# Patient Record
Sex: Male | Born: 1955 | Race: White | Hispanic: No | Marital: Married | Smoking: Never smoker
Health system: Southern US, Community
[De-identification: ages and names within clinical notes are randomized; demographics above are authoritative.]

## PROBLEM LIST (undated history)

## (undated) DIAGNOSIS — N200 Calculus of kidney: Secondary | ICD-10-CM

---

## 2005-11-03 ENCOUNTER — Emergency Department: Payer: Self-pay | Admitting: Emergency Medicine

## 2015-04-07 ENCOUNTER — Emergency Department
Admission: EM | Admit: 2015-04-07 | Discharge: 2015-04-07 | Disposition: A | Discharge status: Home / Self Care | Payer: Self-pay | Attending: Student | Admitting: Student

## 2015-04-07 ENCOUNTER — Encounter: Payer: Self-pay | Admitting: Emergency Medicine

## 2015-04-07 ENCOUNTER — Emergency Department: Payer: No Typology Code available for payment source

## 2015-04-07 DIAGNOSIS — K5900 Constipation, unspecified: Secondary | ICD-10-CM | POA: Insufficient documentation

## 2015-04-07 DIAGNOSIS — Z87891 Personal history of nicotine dependence: Secondary | ICD-10-CM | POA: Insufficient documentation

## 2015-04-07 DIAGNOSIS — N200 Calculus of kidney: Secondary | ICD-10-CM | POA: Insufficient documentation

## 2015-04-07 LAB — COMPREHENSIVE METABOLIC PANEL
ALT: 23 U/L (ref 17–63)
AST: 26 U/L (ref 15–41)
Albumin: 4.5 g/dL (ref 3.5–5.0)
Alkaline Phosphatase: 60 U/L (ref 38–126)
Anion gap: 11 (ref 5–15)
BUN: 20 mg/dL (ref 6–20)
CO2: 24 mmol/L (ref 22–32)
CREATININE: 1.23 mg/dL (ref 0.61–1.24)
Calcium: 9.5 mg/dL (ref 8.9–10.3)
Chloride: 104 mmol/L (ref 101–111)
GFR calc Af Amer: >60 mL/min (ref >60)
GFR calc non Af Amer: >60 mL/min (ref >60)
Glucose, Bld: 167 mg/dL — ABNORMAL HIGH (ref 65–99)
POTASSIUM: 3.9 mmol/L (ref 3.5–5.1)
Sodium: 139 mmol/L (ref 135–145)
TOTAL PROTEIN: 7.8 g/dL (ref 6.5–8.1)
Total Bilirubin: 0.9 mg/dL (ref 0.3–1.2)

## 2015-04-07 LAB — CBC WITH DIFFERENTIAL/PLATELET
BASOS PCT: 0 %
Basophils Absolute: 0 10*3/uL (ref 0–0.1)
EOS PCT: 0 %
Eosinophils Absolute: 0 10*3/uL (ref 0–0.7)
HEMATOCRIT: 44.7 % (ref 40.0–52.0)
Hemoglobin: 15.1 g/dL (ref 13.0–18.0)
LYMPHS ABS: 1.2 10*3/uL (ref 1.0–3.6)
LYMPHS PCT: 8 %
MCH: 30 pg (ref 26.0–34.0)
MCHC: 33.8 g/dL (ref 32.0–36.0)
MCV: 88.8 fL (ref 80.0–100.0)
MONO ABS: 0.6 10*3/uL (ref 0.2–1.0)
MONOS PCT: 4 %
NEUTROS PCT: 88 %
Neutro Abs: 13.2 10*3/uL — ABNORMAL HIGH (ref 1.4–6.5)
Platelets: 307 10*3/uL (ref 150–440)
RBC: 5.04 MIL/uL (ref 4.40–5.90)
RDW: 14.8 % — ABNORMAL HIGH (ref 11.5–14.5)
WBC: 15.1 10*3/uL — AB (ref 3.8–10.6)

## 2015-04-07 LAB — URINALYSIS COMPLETE WITH MICROSCOPIC (ARMC ONLY)
BILIRUBIN URINE: NEGATIVE
GLUCOSE, UA: 50 mg/dL — AB
LEUKOCYTES UA: NEGATIVE
NITRITE: NEGATIVE
PROTEIN: 100 mg/dL — AB
Specific Gravity, Urine: 1.028 (ref 1.005–1.030)
pH: 5 (ref 5.0–8.0)

## 2015-04-07 LAB — LIPASE, BLOOD: Lipase: 33 U/L (ref 22–51)

## 2015-04-07 MED ORDER — ONDANSETRON HCL 4 MG/2ML IJ SOLN
INTRAMUSCULAR | Status: AC
  Administered 2015-04-07: 4 mg via INTRAVENOUS
  Filled 2015-04-07: qty 2

## 2015-04-07 MED ORDER — SODIUM CHLORIDE 0.9 % IV BOLUS (SEPSIS)
1000.0000 mL | Freq: Once | INTRAVENOUS | Status: AC
  Administered 2015-04-07: 1000 mL via INTRAVENOUS

## 2015-04-07 MED ORDER — IOHEXOL 300 MG/ML  SOLN
100.0000 mL | Freq: Once | INTRAMUSCULAR | Status: AC | PRN
  Administered 2015-04-07: 100 mL via INTRAVENOUS

## 2015-04-07 MED ORDER — IOHEXOL 240 MG/ML SOLN
25.0000 mL | Freq: Once | INTRAMUSCULAR | Status: AC | PRN
  Administered 2015-04-07: 25 mL via ORAL

## 2015-04-07 MED ORDER — OXYCODONE HCL 5 MG PO TABS: 5.0000 mg | ORAL_TABLET | Freq: Four times a day (QID) | ORAL | Status: DC | PRN

## 2015-04-07 MED ORDER — ONDANSETRON HCL 4 MG/2ML IJ SOLN
4.0000 mg | Freq: Once | INTRAMUSCULAR | Status: AC
  Administered 2015-04-07: 4 mg via INTRAVENOUS

## 2015-04-07 MED ORDER — TAMSULOSIN HCL 0.4 MG PO CAPS: 0.4000 mg | ORAL_CAPSULE | Freq: Every day | ORAL | Status: DC

## 2015-04-07 MED ORDER — ONDANSETRON HCL 4 MG PO TABS: 4.0000 mg | ORAL_TABLET | Freq: Three times a day (TID) | ORAL | Status: DC | PRN

## 2015-04-07 NOTE — ED Notes (Signed)
Pt c/o LLQ abdominal pain for 2 days.  Has had vomiting with food/drink.  No BM for 2 days.

## 2015-04-07 NOTE — ED Notes (Signed)
Pt ambulatory to stat desk with c/o abd pain. No acute distress noted.

## 2015-04-07 NOTE — ED Notes (Signed)
Pt discharged home after verbalizing understanding of discharge instructions; nad noted. 

## 2015-04-07 NOTE — ED Provider Notes (Signed)
[facility] Emergency Department Provider Note  ____________________________________________  Time seen: Approximately 9:21 PM  I have reviewed the triage vital signs and the nursing notes.   HISTORY  Chief Complaint Abdominal Pain    HPI Lawrence Lloyd is a 59 y.o. male with no chronic medical problems who presents for evaluation of 2 days of gradual onset constant throbbing left lower quadrant pain. Current severity is mild. He has also had several episodes of nonbloody nonbilious emesis. He has been constipated and has not had a bowel movement for 2 days. He thinks he is still able to pass flatus. No fevers. No chest pain or difficulty breathing. No modifying factors. Not radiating or worsened by movement or exertion.  History reviewed. No pertinent past medical history.  There are no active problems to display for this patient.   History reviewed. No pertinent past surgical history.  Current Outpatient Rx  Name  Route  Sig  Dispense  Refill  . Omega-3 Fatty Acids (FISH OIL PO)   Oral   Take by mouth.           Allergies Review of patient's allergies indicates no known allergies.  History reviewed. No pertinent family history.  Social History History  Substance Use Topics  . Smoking status: Former Games developermoker  . Smokeless tobacco: Not on file  . Alcohol Use: Yes     Comment: rare    Review of Systems Constitutional: No fever/chills Eyes: No visual changes. ENT: No sore throat. Cardiovascular: Denies chest pain. Respiratory: Denies shortness of breath. Gastrointestinal: + abdominal pain.  +nausea, + vomiting.  No diarrhea.  + constipation. Genitourinary: Negative for dysuria. Musculoskeletal: Negative for back pain. Skin: Negative for rash. Neurological: Negative for headaches, focal weakness or numbness.  10-point ROS otherwise negative.  ____________________________________________   PHYSICAL EXAM:  VITAL SIGNS: ED Triage  Vitals  Enc Vitals Group     BP 04/07/15 1815 158/90 mmHg     Pulse Rate 04/07/15 1815 77     Resp --      Temp 04/07/15 1815 98.6 F (37 C)     Temp Source 04/07/15 1815 Oral     SpO2 04/07/15 1815 100 %     Weight 04/07/15 1815 225 lb (102.059 kg)     Height 04/07/15 1815 5\' 8"  (1.727 m)     Head Cir --      Peak Flow --      Pain Score 04/07/15 2109 5     Pain Loc --      Pain Edu? --      Excl. in GC? --     Constitutional: Alert and oriented. Well appearing and in no acute distress. Eyes: Conjunctivae are normal. PERRL. EOMI. Head: Atraumatic. Nose: No congestion/rhinnorhea. Mouth/Throat: Mucous membranes are moist.  Oropharynx non-erythematous. Neck: No stridor.  Cardiovascular: Normal rate, regular rhythm. Grossly normal heart sounds.  Good peripheral circulation. Respiratory: Normal respiratory effort.  No retractions. Lungs CTAB. Gastrointestinal: Soft with faint LLQ tenderness, no rebound or guarding. No distention. No abdominal bruits. No CVA tenderness. Genitourinary: deferred Musculoskeletal: No lower extremity tenderness nor edema.  No joint effusions. Neurologic:  Normal speech and language. No gross focal neurologic deficits are appreciated. Speech is normal. No gait instability. Skin:  Skin is warm, dry and intact. No rash noted. Psychiatric: Mood and affect are normal. Speech and behavior are normal.  ____________________________________________   LABS (all labs ordered are listed, but only abnormal results are displayed)  Labs Reviewed  CBC WITH DIFFERENTIAL/PLATELET - Abnormal; Notable for the following:    WBC 15.1 (*)    RDW 14.8 (*)    Neutro Abs 13.2 (*)    All other components within normal limits  COMPREHENSIVE METABOLIC PANEL - Abnormal; Notable for the following:    Glucose, Bld 167 (*)    All other components within normal limits  URINALYSIS COMPLETEWITH MICROSCOPIC ([facility] ONLY) - Abnormal; Notable for the following:    Color, Urine YELLOW  (*)    APPearance TURBID (*)    Glucose, UA 50 (*)    Ketones, ur 2+ (*)    Hgb urine dipstick 3+ (*)    Protein, ur 100 (*)    Bacteria, UA RARE (*)    Squamous Epithelial / LPF 0-5 (*)    All other components within normal limits  LIPASE, BLOOD   ____________________________________________  EKG  none ____________________________________________  RADIOLOGY  CT A/P IMPRESSION: 1. 7 x 4 mm proximal left ureteral stone with mild hydronephrosis. 2. Bilateral nephrolithiasis. 3. Hepatic steatosis. 4. 12 mm left adrenal nodule, nonspecific but statistically likely to represent adenoma. ____________________________________________   PROCEDURES  Procedure(s) performed: None  Critical Care performed: No  ____________________________________________   INITIAL IMPRESSION / ASSESSMENT AND PLAN / ED COURSE  Pertinent labs & imaging results that were available during my care of the patient were reviewed by me and considered in my medical decision making (see chart for details).  Lawrence Lloyd is a 59 y.o. male with no chronic medical problems who presents for evaluation of 2 days of gradual onset constant throbbing left lower quadrant pain. On exam, he is very well-appearing and in no acute distress. Vital signs stable, afebrile. He has leukocytosis as well as mild tenderness in left lower quadrant. We'll plan for CT of the abdomen and pelvis to evaluate further cause of his pain. He has declined any pain medications at this time.  ----------------------------------------- 11:27 PM on 04/07/2015 -----------------------------------------  CT shows 7 x 4 mm proximal left ureteral stone with mild hydronephrosis. No fever. Urinalysis does not appear to be consistent with an infected stone. Discussed with Dr. Vanna Scotland of urology who agrees with discharge and follow-up in urology clinic. We'll discharge with expectant management. Return precautions discussed extensively with the  patient and family and he is comfortable with discharge. He is tolerating PO. ____________________________________________   FINAL CLINICAL IMPRESSION(S) / ED DIAGNOSES  Final diagnoses:  Nephrolithiasis      Gayla Doss, MD 04/07/15 2328

## 2015-04-07 NOTE — ED Notes (Signed)
Patient awake, alert and oriented.  Verbalizing good understanding of discharge instructions, follow up care and medications.

## 2015-04-07 NOTE — Discharge Instructions (Signed)

## 2015-04-07 NOTE — ED Notes (Signed)
Pt has cup, unable to urinate at this time

## 2015-04-29 ENCOUNTER — Encounter: Payer: Self-pay | Admitting: *Deleted

## 2015-05-03 ENCOUNTER — Ambulatory Visit (INDEPENDENT_AMBULATORY_CARE_PROVIDER_SITE_OTHER): Payer: PRIVATE HEALTH INSURANCE | Admitting: Urology

## 2015-05-03 ENCOUNTER — Encounter: Payer: Self-pay | Admitting: Urology

## 2015-05-03 ENCOUNTER — Encounter
Admission: RE | Admit: 2015-05-03 | Discharge: 2015-05-03 | Disposition: A | Discharge status: Home / Self Care | Payer: PRIVATE HEALTH INSURANCE | Source: Ambulatory Visit | Attending: Urology | Admitting: Urology

## 2015-05-03 ENCOUNTER — Ambulatory Visit
Admission: RE | Admit: 2015-05-03 | Discharge: 2015-05-03 | Disposition: A | Discharge status: Home / Self Care | Payer: PRIVATE HEALTH INSURANCE | Source: Ambulatory Visit | Attending: Urology | Admitting: Urology

## 2015-05-03 VITALS — BP 159/90 | HR 84 | Ht 69.0 in | Wt 233.0 lb

## 2015-05-03 DIAGNOSIS — N132 Hydronephrosis with renal and ureteral calculous obstruction: Secondary | ICD-10-CM | POA: Insufficient documentation

## 2015-05-03 DIAGNOSIS — N200 Calculus of kidney: Secondary | ICD-10-CM | POA: Diagnosis not present

## 2015-05-03 DIAGNOSIS — R1032 Left lower quadrant pain: Secondary | ICD-10-CM | POA: Diagnosis not present

## 2015-05-03 DIAGNOSIS — N201 Calculus of ureter: Secondary | ICD-10-CM | POA: Diagnosis present

## 2015-05-03 DIAGNOSIS — R112 Nausea with vomiting, unspecified: Secondary | ICD-10-CM | POA: Diagnosis not present

## 2015-05-03 DIAGNOSIS — Z79899 Other long term (current) drug therapy: Secondary | ICD-10-CM | POA: Diagnosis not present

## 2015-05-03 DIAGNOSIS — Z87891 Personal history of nicotine dependence: Secondary | ICD-10-CM | POA: Diagnosis not present

## 2015-05-03 DIAGNOSIS — K760 Fatty (change of) liver, not elsewhere classified: Secondary | ICD-10-CM | POA: Diagnosis not present

## 2015-05-03 LAB — MICROSCOPIC EXAMINATION: Bacteria, UA: NONE SEEN

## 2015-05-03 LAB — URINALYSIS, COMPLETE
BILIRUBIN UA: NEGATIVE
Ketones, UA: NEGATIVE
Leukocytes, UA: NEGATIVE
Nitrite, UA: NEGATIVE
PH UA: 7 (ref 5.0–7.5)
PROTEIN UA: NEGATIVE
SPEC GRAV UA: 1.015 (ref 1.005–1.030)
UUROB: 0.2 mg/dL (ref 0.2–1.0)

## 2015-05-03 NOTE — Progress Notes (Signed)
05/03/2015 3:54 PM   Lawrence Lloyd 10/05/1956 [id number]  Referring provider: No referring provider defined for this encounter.  Chief Complaint  Patient presents with  . Nephrolithiasis    ER referral    HPI: Lawrence Lloyd is a 59 year old white male who presented to the emergency room on 04/07/2015 after a 2 day history of left sided abdominal pain. He stated when the pain presented initially, he just ignored it hoping it was going to go away on its own. The pain became increasingly worse over the next 2 days until it became so intense he felt like he was going to pass out pass out.   He sought further treatment in the ED.   There, a CT scan noted a 7 mm stone in the left proximal ureter with mild hydronephrosis.   He was discharged from the emergency room with oxycodone, Zofran and tamsulosin and instructed to follow-up with [place].  Currently, he is experiencing intermittent bouts of flank pain. He can go 2 days with no pain and then the next day have severe pain lasting 4-5 hours. He denies any fevers, chills or gross hematuria. He does admit to nausea and vomiting. He does not have a prior history of kidney stone disease. Up until his emergency room visit, he was consuming three 12 ounces of [facility] daily. His father also has a history of kidney stones.   PMH: Benign  Surgical History: No past surgical history on file.  Home Medications:    Medication List       This list is accurate as of: 05/03/15  3:54 PM.  Always use your most recent med list.               FISH OIL PO  Take by mouth.     ondansetron 4 MG tablet  Commonly known as:  ZOFRAN  Take 1 tablet (4 mg total) by mouth every 8 (eight) hours as needed for nausea or vomiting.     oxyCODONE 5 MG immediate release tablet  Commonly known as:  ROXICODONE  Take 1 tablet (5 mg total) by mouth every 6 (six) hours as needed for moderate pain (may take 1-2 tabs PO q6h prn pain; do NOT drive while taking this  medication).     tamsulosin 0.4 MG Caps capsule  Commonly known as:  FLOMAX  Take 1 capsule (0.4 mg total) by mouth daily.        Allergies: No Known Allergies  Family History: Family History  Problem Relation Age of Onset  . Kidney Stones Father   . COPD Father   . Heart disease Father   . Cancer Neg Hx     Prostate,kidney,bladder  . Dementia Mother     Social History:  reports that he has quit smoking. He does not have any smokeless tobacco history on file. He reports that he drinks alcohol. He reports that he does not use illicit drugs.  ROS: Urological Symptom Review  Patient is experiencing the following symptoms: Kidney stone   Review of Systems  Gastrointestinal (upper)  : Nausea Vomiting  Gastrointestinal (lower) : Negative for lower GI symptoms  Constitutional : Negative for symptoms  Skin: Negative for skin symptoms  Eyes: Negative for eye symptoms  Ear/Nose/Throat : Negative for Ear/Nose/Throat symptoms  Hematologic/Lymphatic: Negative for Hematologic/Lymphatic symptoms  Cardiovascular : Negative for cardiovascular symptoms  Respiratory : Negative for respiratory symptoms  Endocrine: Negative for endocrine symptoms  Musculoskeletal: Negative for musculoskeletal symptoms  Neurological: Negative  for neurological symptoms  Psychologic: Negative for psychiatric symptoms   Physical Exam: BP 159/90 mmHg  Pulse 84  Ht  (1.753 m)  Wt 233 lb (105.688 kg)  BMI 34.39 kg/m2  Constitutional:  Alert and oriented, No acute distress. HEENT: [place] AT, moist mucus membranes.  Trachea midline, no masses. Cardiovascular: No clubbing, cyanosis, or edema. Respiratory: Normal respiratory effort, no increased work of breathing. GI: Abdomen is soft, non tender, non distended, no abdominal masses GU: No CVA tenderness.  Skin: No rashes, bruises or suspicious lesions. Lymph: No cervical or inguinal adenopathy. Neurologic: Grossly intact, no  focal deficits, moving all 4 extremities. Psychiatric: Normal mood and affect.  Laboratory Data: Results for orders placed or performed in visit on 05/03/15  Microscopic Examination  Result Value Ref Range   WBC, UA 0-5 0 -  5 /hpf   RBC, UA 3-10 (A) 0 -  2 /hpf   Epithelial Cells (non renal) 0-10 0 - 10 /hpf   Bacteria, UA None seen None seen/Few  Urinalysis, Complete  Result Value Ref Range   Specific Gravity, UA 1.015 1.005 - 1.030   pH, UA 7.0 5.0 - 7.5   Color, UA Yellow Yellow   Appearance Ur Clear Clear   Leukocytes, UA Negative Negative   Protein, UA Negative Negative/Trace   Glucose, UA 1+ (A) Negative   Ketones, UA Negative Negative   RBC, UA 1+ (A) Negative   Bilirubin, UA Negative Negative   Urobilinogen, Ur 0.2 0.2 - 1.0 mg/dL   Nitrite, UA Negative Negative   Microscopic Examination See below:    Lab Results  Component Value Date   WBC 15.1* 04/07/2015   HGB 15.1 04/07/2015   HCT 44.7 04/07/2015   MCV 88.8 04/07/2015   PLT 307 04/07/2015    Lab Results  Component Value Date   CREATININE 1.23 04/07/2015    No results found for: PSA  No results found for: TESTOSTERONE  No results found for: HGBA1C  Urinalysis    Component Value Date/Time   COLORURINE YELLOW* 04/07/2015 1826   APPEARANCEUR TURBID* 04/07/2015 1826   LABSPEC 1.028 04/07/2015 1826   PHURINE 5.0 04/07/2015 1826   GLUCOSEU 1+* 05/03/2015 1001   HGBUR 3+* 04/07/2015 1826   BILIRUBINUR Negative 05/03/2015 1001   BILIRUBINUR NEGATIVE 04/07/2015 1826   KETONESUR 2+* 04/07/2015 1826   PROTEINUR 100* 04/07/2015 1826   NITRITE Negative 05/03/2015 1001   NITRITE NEGATIVE 04/07/2015 1826   LEUKOCYTESUR Negative 05/03/2015 1001   LEUKOCYTESUR NEGATIVE 04/07/2015 1826    Pertinent Imaging: CLINICAL DATA: Left lower quadrant pain for 2 days  EXAM: CT ABDOMEN AND PELVIS WITH CONTRAST  TECHNIQUE: Multidetector CT imaging of the abdomen and pelvis was performed using the standard  protocol following bolus administration of intravenous contrast.  CONTRAST: OMNIPAQUE IOHEXOL 300 MG/ML SOLN  COMPARISON: None.  FINDINGS: BODY WALL: No contributory findings.  LOWER CHEST: No contributory findings.  ABDOMEN/PELVIS:  Liver: Hepatic steatosis  Biliary: No evidence of biliary obstruction or stone.  Pancreas: Unremarkable.  Spleen: Unremarkable.  Adrenals: 12 mm nodule left adrenal gland appears homogeneous and low-density. No history of malignancy in the electronic medical record.  Kidneys and ureters: 7 x 4 mm stone in the proximal left ureter with mild hydronephrosis. Bilateral nephrolithiasis with upper pole calculi. The largest stone is on the left and measures up to 7 mm. Bilateral renal cortical cysts, largest on the right measuring 33 mm  Bladder: Unremarkable.  Reproductive: No pathologic findings.  Bowel:  No obstruction. Normal appendix.  Retroperitoneum: No mass or adenopathy.  Peritoneum: No ascites or pneumoperitoneum.  Vascular: No acute abnormality.  OSSEOUS: Diffuse degenerative disc disease with maximal disc narrowing at L5-S1. Lower lumbar facet arthropathy with spurring.  IMPRESSION: 1. 7 x 4 mm proximal left ureteral stone with mild hydronephrosis. 2. Bilateral nephrolithiasis. 3. Hepatic steatosis. 4. 12 mm left adrenal nodule, nonspecific but statistically likely to represent adenoma.   Electronically Signed  By: Marnee Spring M.D.  On: 04/07/2015 23:08  Assessment & Plan:    1. Left ureteral stone:  I discussed ESWL and URS/LL/ureteral stent placement as options for the treatment of his stone. Since the stone is visible on KUB, he would like to proceed with ESWL.  I explained to the patient the procedure has approximately 80% success rate in treating stones. If the stone does not break up with ESWL, he will require a repeat ESWL or another procedure for definitive treatment of the stone.  I explained the risks of ESWL to the patient, such as: infection, pain, bleeding and the phenomenon of Steinstrausse, a condition when the fragments stack upon themselves causing obstruction. If this should occur, he would need ureteral stent placement.  He is agreeable to the plan and the risks and would like to proceed with ESWL.  - Urinalysis, Complete - CULTURE, URINE COMPREHENSIVE  2. Hydronephrosis:  Patient will have a follow-up renal ultrasound once the stone is treated to ensure the resolution of the hydronephrosis.    3. Bilateral nephrolithiasis:  Patient was informed that consuming beverages like [facility] contributed to stone formation. He states he read that on the Internet and has been drinking water since the stone pain started. His father also has a history of nephrolithiasis. We will obtain a 24-hour urine once the left ureteral stone is treated.  4. PSA Screening:  Patient does not have a primary care physician and has not had a recent prostate exam. Once the stone has been treated, we will discuss PSA screening as it is recommended in his age group. No Follow-up on file.  Michiel Cowboy, PA-C  [facility] [address], [address] [place], [place] [address] [phone]

## 2015-05-05 LAB — CULTURE, URINE COMPREHENSIVE

## 2015-05-06 ENCOUNTER — Ambulatory Visit
Admission: RE | Admit: 2015-05-06 | Discharge: 2015-05-06 | Disposition: A | Discharge status: Home / Self Care | Payer: PRIVATE HEALTH INSURANCE | Source: Ambulatory Visit | Attending: Urology | Admitting: Urology

## 2015-05-06 ENCOUNTER — Ambulatory Visit: Payer: PRIVATE HEALTH INSURANCE

## 2015-05-06 ENCOUNTER — Encounter
Admission: RE | Disposition: A | Discharge status: Home / Self Care | Payer: Self-pay | Source: Ambulatory Visit | Attending: Urology

## 2015-05-06 ENCOUNTER — Encounter: Payer: Self-pay | Admitting: *Deleted

## 2015-05-06 DIAGNOSIS — K760 Fatty (change of) liver, not elsewhere classified: Secondary | ICD-10-CM | POA: Insufficient documentation

## 2015-05-06 DIAGNOSIS — N200 Calculus of kidney: Secondary | ICD-10-CM

## 2015-05-06 DIAGNOSIS — R112 Nausea with vomiting, unspecified: Secondary | ICD-10-CM | POA: Insufficient documentation

## 2015-05-06 DIAGNOSIS — Z87891 Personal history of nicotine dependence: Secondary | ICD-10-CM | POA: Insufficient documentation

## 2015-05-06 DIAGNOSIS — Z79899 Other long term (current) drug therapy: Secondary | ICD-10-CM | POA: Insufficient documentation

## 2015-05-06 DIAGNOSIS — N132 Hydronephrosis with renal and ureteral calculous obstruction: Secondary | ICD-10-CM | POA: Diagnosis not present

## 2015-05-06 DIAGNOSIS — R1032 Left lower quadrant pain: Secondary | ICD-10-CM | POA: Insufficient documentation

## 2015-05-06 HISTORY — PX: EXTRACORPOREAL SHOCK WAVE LITHOTRIPSY: SHX1557

## 2015-05-06 HISTORY — DX: Calculus of kidney: N20.0

## 2015-05-06 SURGERY — LITHOTRIPSY, ESWL
Anesthesia: Monitor Anesthesia Care | Laterality: Left

## 2015-05-06 MED ORDER — CIPROFLOXACIN HCL 500 MG PO TABS
ORAL_TABLET | ORAL | Status: AC
  Administered 2015-05-06: 500 mg via ORAL
  Filled 2015-05-06: qty 1

## 2015-05-06 MED ORDER — DEXTROSE-NACL 5-0.45 % IV SOLN
INTRAVENOUS | Status: DC
  Administered 2015-05-06: 100 mL/h via INTRAVENOUS

## 2015-05-06 MED ORDER — DIPHENHYDRAMINE HCL 25 MG PO CAPS
25.0000 mg | ORAL_CAPSULE | ORAL | Status: AC
  Administered 2015-05-06: 25 mg via ORAL

## 2015-05-06 MED ORDER — DIPHENHYDRAMINE HCL 25 MG PO CAPS
ORAL_CAPSULE | ORAL | Status: AC
  Administered 2015-05-06: 25 mg via ORAL
  Filled 2015-05-06: qty 1

## 2015-05-06 MED ORDER — CIPROFLOXACIN HCL 500 MG PO TABS
500.0000 mg | ORAL_TABLET | ORAL | Status: AC
  Administered 2015-05-06: 500 mg via ORAL

## 2015-05-06 MED ORDER — DIAZEPAM 5 MG PO TABS
ORAL_TABLET | ORAL | Status: AC
  Administered 2015-05-06: 10 mg via ORAL
  Filled 2015-05-06: qty 2

## 2015-05-06 MED ORDER — OXYCODONE HCL 5 MG PO TABS: 5.0000 mg | ORAL_TABLET | Freq: Four times a day (QID) | ORAL | Status: DC | PRN

## 2015-05-06 MED ORDER — DIAZEPAM 5 MG PO TABS
10.0000 mg | ORAL_TABLET | ORAL | Status: AC
  Administered 2015-05-06: 10 mg via ORAL

## 2015-05-06 NOTE — Discharge Instructions (Signed)
See [facility] discharge instructions in chart.  Please get xray on the day of your follow up just prior to your follow up appointment.    Follow the lithotripsy instructions as reviewed in office.

## 2015-05-06 NOTE — OR Nursing (Signed)
Patient denies any of the medical history that was noted by the CMA at the urologist office.  All history was reviewed with patient through the lithotripsy RN and patient states he is not aware of any of the diagnosis' that were checked for his medical history.  Patient states no problems other than kidney stones.

## 2015-05-06 NOTE — H&P (Signed)
05/03/2015 3:54 PM   Lawrence Lloyd 18-May-1956 [id number]  Referring provider: No referring provider defined for this encounter.  Chief Complaint  Patient presents with  . Nephrolithiasis    ER referral    HPI: Lawrence Lloyd is a 59 year old white male who presented to the emergency room on 04/07/2015 after a 2 day history of left sided abdominal pain. He stated when the pain presented initially, he just ignored it hoping it was going to go away on its own. The pain became increasingly worse over the next 2 days until it became so intense he felt like he was going to pass out pass out.   He sought further treatment in the ED. There, a CT scan noted a 7 mm stone in the left proximal ureter with mild hydronephrosis. He was discharged from the emergency room with oxycodone, Zofran and tamsulosin and instructed to follow-up with [place].  Currently, he is experiencing intermittent bouts of flank pain. He can go 2 days with no pain and then the next day have severe pain lasting 4-5 hours. He denies any fevers, chills or gross hematuria. He does admit to nausea and vomiting. He does not have a prior history of kidney stone disease. Up until his emergency room visit, he was consuming three 12 ounces of [facility] daily. His father also has a history of kidney stones.   PMH: Benign  Surgical History: No past surgical history on file.  Home Medications:    Medication List       This list is accurate as of: 05/03/15 3:54 PM. Always use your most recent med list.              FISH OIL PO  Take by mouth.     ondansetron 4 MG tablet  Commonly known as: ZOFRAN  Take 1 tablet (4 mg total) by mouth every 8 (eight) hours as needed for nausea or vomiting.     oxyCODONE 5 MG immediate release tablet  Commonly known as: ROXICODONE  Take 1 tablet (5 mg total) by mouth every 6 (six) hours as needed for moderate pain (may take 1-2 tabs PO q6h prn pain; do  NOT drive while taking this medication).     tamsulosin 0.4 MG Caps capsule  Commonly known as: FLOMAX  Take 1 capsule (0.4 mg total) by mouth daily.        Allergies: No Known Allergies  Family History: Family History  Problem Relation Age of Onset  . Kidney Stones Father   . COPD Father   . Heart disease Father   . Cancer Neg Hx     Prostate,kidney,bladder  . Dementia Mother     Social History:  reports that he has quit smoking. He does not have any smokeless tobacco history on file. He reports that he drinks alcohol. He reports that he does not use illicit drugs.  ROS: Urological Symptom Review  Patient is experiencing the following symptoms: Kidney stone   Review of Systems  Gastrointestinal (upper) : Nausea Vomiting  Gastrointestinal (lower) : Negative for lower GI symptoms  Constitutional : Negative for symptoms  Skin: Negative for skin symptoms  Eyes: Negative for eye symptoms  Ear/Nose/Throat : Negative for Ear/Nose/Throat symptoms  Hematologic/Lymphatic: Negative for Hematologic/Lymphatic symptoms  Cardiovascular : Negative for cardiovascular symptoms  Respiratory : Negative for respiratory symptoms  Endocrine: Negative for endocrine symptoms  Musculoskeletal: Negative for musculoskeletal symptoms  Neurological: Negative for neurological symptoms  Psychologic: Negative for psychiatric symptoms  Physical Exam: BP 159/90 mmHg  Pulse 84  Ht  (1.753 m)  Wt 233 lb (105.688 kg)  BMI 34.39 kg/m2  Constitutional: Alert and oriented, No acute distress. HEENT: [place] AT, moist mucus membranes. Trachea midline, no masses. Cardiovascular: No clubbing, cyanosis, or edema. Respiratory: Normal respiratory effort, no increased work of breathing. GI: Abdomen is soft, non tender, non distended, no abdominal masses GU: No CVA tenderness.  Skin: No rashes, bruises or suspicious lesions. Lymph: No  cervical or inguinal adenopathy. Neurologic: Grossly intact, no focal deficits, moving all 4 extremities. Psychiatric: Normal mood and affect.  Laboratory Data: Results for orders placed or performed in visit on 05/03/15  Microscopic Examination  Result Value Ref Range   WBC, UA 0-5 0 - 5 /hpf   RBC, UA 3-10 (A) 0 - 2 /hpf   Epithelial Cells (non renal) 0-10 0 - 10 /hpf   Bacteria, UA None seen None seen/Few  Urinalysis, Complete  Result Value Ref Range   Specific Gravity, UA 1.015 1.005 - 1.030   pH, UA 7.0 5.0 - 7.5   Color, UA Yellow Yellow   Appearance Ur Clear Clear   Leukocytes, UA Negative Negative   Protein, UA Negative Negative/Trace   Glucose, UA 1+ (A) Negative   Ketones, UA Negative Negative   RBC, UA 1+ (A) Negative   Bilirubin, UA Negative Negative   Urobilinogen, Ur 0.2 0.2 - 1.0 mg/dL   Nitrite, UA Negative Negative   Microscopic Examination See below:     Recent Labs    Lab Results  Component Value Date   WBC 15.1* 04/07/2015   HGB 15.1 04/07/2015   HCT 44.7 04/07/2015   MCV 88.8 04/07/2015   PLT 307 04/07/2015       Recent Labs    Lab Results  Component Value Date   CREATININE 1.23 04/07/2015       Recent Labs    No results found for: PSA     Recent Labs    No results found for: TESTOSTERONE     Recent Labs    No results found for: HGBA1C    Urinalysis  Labs (Brief)       Component Value Date/Time   COLORURINE YELLOW* 04/07/2015 1826   APPEARANCEUR TURBID* 04/07/2015 1826   LABSPEC 1.028 04/07/2015 1826   PHURINE 5.0 04/07/2015 1826   GLUCOSEU 1+* 05/03/2015 1001   HGBUR 3+* 04/07/2015 1826   BILIRUBINUR Negative 05/03/2015 1001   BILIRUBINUR NEGATIVE 04/07/2015 1826   KETONESUR 2+* 04/07/2015 1826   PROTEINUR 100* 04/07/2015 1826   NITRITE Negative 05/03/2015 1001    NITRITE NEGATIVE 04/07/2015 1826   LEUKOCYTESUR Negative 05/03/2015 1001   LEUKOCYTESUR NEGATIVE 04/07/2015 1826      Pertinent Imaging: CLINICAL DATA: Left lower quadrant pain for 2 days  EXAM: CT ABDOMEN AND PELVIS WITH CONTRAST  TECHNIQUE: Multidetector CT imaging of the abdomen and pelvis was performed using the standard protocol following bolus administration of intravenous contrast.  CONTRAST: OMNIPAQUE IOHEXOL 300 MG/ML SOLN  COMPARISON: None.  FINDINGS: BODY WALL: No contributory findings.  LOWER CHEST: No contributory findings.  ABDOMEN/PELVIS:  Liver: Hepatic steatosis  Biliary: No evidence of biliary obstruction or stone.  Pancreas: Unremarkable.  Spleen: Unremarkable.  Adrenals: 12 mm nodule left adrenal gland appears homogeneous and low-density. No history of malignancy in the electronic medical record.  Kidneys and ureters: 7 x 4 mm stone in the proximal left ureter with mild hydronephrosis. Bilateral nephrolithiasis with upper pole calculi. The  largest stone is on the left and measures up to 7 mm. Bilateral renal cortical cysts, largest on the right measuring 33 mm  Bladder: Unremarkable.  Reproductive: No pathologic findings.  Bowel: No obstruction. Normal appendix.  Retroperitoneum: No mass or adenopathy.  Peritoneum: No ascites or pneumoperitoneum.  Vascular: No acute abnormality.  OSSEOUS: Diffuse degenerative disc disease with maximal disc narrowing at L5-S1. Lower lumbar facet arthropathy with spurring.  IMPRESSION: 1. 7 x 4 mm proximal left ureteral stone with mild hydronephrosis. 2. Bilateral nephrolithiasis. 3. Hepatic steatosis. 4. 12 mm left adrenal nodule, nonspecific but statistically likely to represent adenoma.   Electronically Signed  By: Marnee Spring M.D.  On: 04/07/2015 23:08  Assessment & Plan:   1. Left ureteral stone: I discussed ESWL and URS/LL/ureteral  stent placement as options for the treatment of his stone. Since the stone is visible on KUB, he would like to proceed with ESWL. I explained to the patient the procedure has approximately 80% success rate in treating stones. If the stone does not break up with ESWL, he will require a repeat ESWL or another procedure for definitive treatment of the stone. I explained the risks of ESWL to the patient, such as: infection, pain, bleeding and the phenomenon of Steinstrausse, a condition when the fragments stack upon themselves causing obstruction. If this should occur, he would need ureteral stent placement. He is agreeable to the plan and the risks and would like to proceed with ESWL.  - Urinalysis, Complete - CULTURE, URINE COMPREHENSIVE  2. Hydronephrosis: Patient will have a follow-up renal ultrasound once the stone is treated to ensure the resolution of the hydronephrosis.   3. Bilateral nephrolithiasis: Patient was informed that consuming beverages like [facility] contributed to stone formation. He states he read that on the Internet and has been drinking water since the stone pain started. His father also has a history of nephrolithiasis. We will obtain a 24-hour urine once the left ureteral stone is treated.  4. PSA Screening: Patient does not have a primary care physician and has not had a recent prostate exam. Once the stone has been treated, we will discuss PSA screening as it is recommended in his age group. No Follow-up on file.  Michiel Cowboy, PA-C  [facility] [address] [place], [place] [address] [phone]

## 2015-05-31 ENCOUNTER — Ambulatory Visit
Admission: RE | Admit: 2015-05-31 | Discharge: 2015-05-31 | Disposition: A | Discharge status: Home / Self Care | Payer: PRIVATE HEALTH INSURANCE | Source: Ambulatory Visit | Attending: Urology | Admitting: Urology

## 2015-05-31 ENCOUNTER — Ambulatory Visit (INDEPENDENT_AMBULATORY_CARE_PROVIDER_SITE_OTHER): Payer: PRIVATE HEALTH INSURANCE | Admitting: Urology

## 2015-05-31 ENCOUNTER — Encounter: Payer: Self-pay | Admitting: Urology

## 2015-05-31 VITALS — BP 169/101 | HR 75 | Ht 69.0 in | Wt 229.7 lb

## 2015-05-31 DIAGNOSIS — Z125 Encounter for screening for malignant neoplasm of prostate: Secondary | ICD-10-CM

## 2015-05-31 DIAGNOSIS — N201 Calculus of ureter: Secondary | ICD-10-CM | POA: Diagnosis not present

## 2015-05-31 DIAGNOSIS — N200 Calculus of kidney: Secondary | ICD-10-CM | POA: Diagnosis not present

## 2015-05-31 DIAGNOSIS — I10 Essential (primary) hypertension: Secondary | ICD-10-CM

## 2015-05-31 LAB — URINALYSIS, COMPLETE
Bilirubin, UA: NEGATIVE
Glucose, UA: NEGATIVE
Ketones, UA: NEGATIVE
LEUKOCYTES UA: NEGATIVE
Nitrite, UA: NEGATIVE
Protein, UA: NEGATIVE
RBC, UA: NEGATIVE
Specific Gravity, UA: 1.025 (ref 1.005–1.030)
UUROB: 0.2 mg/dL (ref 0.2–1.0)
pH, UA: 5.5 (ref 5.0–7.5)

## 2015-05-31 LAB — MICROSCOPIC EXAMINATION

## 2015-05-31 NOTE — Progress Notes (Signed)
10:55 AM   Lawrence Lloyd 01-Dec-1955 [id number]  Referring provider: No referring provider defined for this encounter.  Chief Complaint  Patient presents with  . Nephrolithiasis    post ESWL w/KUB    HPI: 59 year old male with a 7 mm left distal ureteral stone status post ESWL. He tolerated the procedure quite well. He's had no further flank pain on the left side.  He did strain his urine but did not see any stone fragments pass. KUB today shows interval passage of his left distal stone.  Today, he denies any fevers, chills or gross hematuria.    He does not have a prior history of kidney stone disease. Up until his emergency room visit, he was consuming three 12 ounces of [facility] daily. His father also has a history of kidney stones.  He does have other bilateral nonobstructing stones.  Patient does not have a PCP.   PMH: Benign  Surgical History: History reviewed. No pertinent past surgical history.  Home Medications:    Medication List       This list is accurate as of: 05/31/15 10:55 AM.  Always use your most recent med list.               FISH OIL PO  Take by mouth.     multivitamin capsule  Take 1 capsule by mouth daily.        Allergies: No Known Allergies  Family History: Family History  Problem Relation Age of Onset  . Kidney Stones Father   . COPD Father   . Heart disease Father   . Cancer Neg Hx     Prostate,kidney,bladder  . Dementia Mother     Social History:  reports that he has never smoked. He has quit using smokeless tobacco. His smokeless tobacco use included Chew. He reports that he drinks alcohol. He reports that he does not use illicit drugs.   ROS UROLOGY Frequent Urination?: No Hard to postpone urination?: No Burning/pain with urination?: No Get up at night to urinate?: No Leakage of urine?: No Urine stream starts and stops?: No Trouble starting stream?: No Do you have to strain to urinate?: No Blood in urine?:  No Urinary tract infection?: No Sexually transmitted disease?: No Injury to kidneys or bladder?: No Painful intercourse?: No Weak stream?: No Erection problems?: No Penile pain?: No Gastrointestinal Nausea?: No Vomiting?: No Indigestion/heartburn?: No Diarrhea?: No Constipation?: No Constitutional Fever: No Night sweats?: No Weight loss?: No Fatigue?: No Skin Skin rash/lesions?: No Itching?: No Eyes Blurred vision?: No Double vision?: No Ears/Nose/Throat Sore throat?: No Sinus problems?: No Hematologic/Lymphatic Swollen glands?: No Easy bruising?: No Cardiovascular Leg swelling?: No Chest pain?: No Respiratory Cough?: No Shortness of breath?: No Endocrine Excessive thirst?: No Musculoskeletal Back pain?: No Joint pain?: No Neurological Headaches?: No Dizziness?: No Psychologic Depression?: No Anxiety?: No  Physical Exam: BP 169/101 mmHg  Pulse 75  Ht  (1.753 m)  Wt 229 lb 11.2 oz (104.191 kg)  BMI 33.91 kg/m2  Constitutional:  Alert and oriented, No acute distress. HEENT: [place] AT, moist mucus membranes.  Trachea midline, no masses. Cardiovascular: No clubbing, cyanosis, or edema. Respiratory: Normal respiratory effort, no increased work of breathing. GI: Abdomen is soft, non tender, non distended, no abdominal masses GU: No CVA tenderness.  Neurologic: Grossly intact, no focal deficits, moving all 4 extremities. Psychiatric: Normal mood and affect.  Laboratory Data: Lab Results  Component Value Date   WBC 15.1* 04/07/2015   HGB  15.1 04/07/2015   HCT 44.7 04/07/2015   MCV 88.8 04/07/2015   PLT 307 04/07/2015    Lab Results  Component Value Date   CREATININE 1.23 04/07/2015    Urinalysis Urine dipstick shows negative for all components.  Micro exam: 6-10 WBC's per HPF.  Pertinent Imaging: CLINICAL DATA: Kidney stones.  EXAM: ABDOMEN - 1 VIEW  COMPARISON: None.  FINDINGS: Bilateral nephrolithiasis. Previously identified  left lower urolithiasis has cleared. Pelvic calcifications consistent phleboliths. Bowel gas pattern is unremarkable. No acute bony abnormality. Degenerative changes lumbar spine .  IMPRESSION: 1. Interim passage of distal left ureteral stone. 2. Stable bilateral nephrolithiasis .   Electronically Signed  By: Maisie Fushomas Register  On: 05/31/2015 09:01  Assessment & Plan:  59 year old male with history of left ureteral nephrolithiasis status post successful ESWL.  1. Left ureteral stone KUB today shows resolution of stone.   - Urinalysis, Complete  2. Kidney stones Bilateral nonobstructing stones seen, largest 7 mm.  Patient wishes to see if he can pass these stones. Recommend follow-up with KUB in 1 year.We discussed general stone prevention techniques including drinking plenty water with goal of producing 2.5 L urine daily, increased citric acid intake, avoidance of high oxalate containing foods, and decreased salt intake.  Information about dietary recommendations given today.   - DG Abd 1 View; Future  3. Essential hypertension Hypertensive today in clinic, asymptomatic. Patient does not have a primary care physician. I have highly recommended that he seek out medical care with a primary care physician in the near future.  Suspect he had underlying hypertension prior to any of these procedures and was just not seeking care previously.  4. Prostate cancer screening The natural history of prostate cancer and ongoing controversy regarding screening and potential treatment outcomes of prostate cancer has been discussed with the patient. The meaning of a false positive PSA and a false negative PSA has been discussed. He indicates understanding of the limitations of this screening test and wishes NOT to proceed with screening PSA testing.   Return in about 6 months (around 12/01/2015) for KUB.  Vanna ScotlandAshley Mindel Friscia, MD  [facility] [address], [address] [place], [place] [address] [phone]

## 2015-05-31 NOTE — Patient Instructions (Signed)
Dietary Guidelines to Help Prevent Kidney Stones  Your risk of kidney stones can be decreased by adjusting the foods you eat. The most important thing you can do is drink enough fluid. You should drink enough fluid to keep your urine clear or pale yellow. The following guidelines provide specific information for the type of kidney stone you have had.  GUIDELINES ACCORDING TO TYPE OF KIDNEY STONE  Calcium Oxalate Kidney Stones  · Reduce the amount of salt you eat. Foods that have a lot of salt cause your body to release excess calcium into your urine. The excess calcium can combine with a substance called oxalate to form kidney stones.  · Reduce the amount of animal protein you eat if the amount you eat is excessive. Animal protein causes your body to release excess calcium into your urine. Ask your dietitian how much protein from animal sources you should be eating.  · Avoid foods that are high in oxalates. If you take vitamins, they should have less than 500 mg of vitamin C. Your body turns vitamin C into oxalates. You do not need to avoid fruits and vegetables high in vitamin C.  Calcium Phosphate Kidney Stones  · Reduce the amount of salt you eat to help prevent the release of excess calcium into your urine.  · Reduce the amount of animal protein you eat if the amount you eat is excessive. Animal protein causes your body to release excess calcium into your urine. Ask your dietitian how much protein from animal sources you should be eating.  · Get enough calcium from food or take a calcium supplement (ask your dietitian for recommendations). Food sources of calcium that do not increase your risk of kidney stones include:  ¨ Broccoli.  ¨ Dairy products, such as cheese and yogurt.  ¨ Pudding.  Uric Acid Kidney Stones  · Do not have more than 6 oz of animal protein per day.  FOOD SOURCES  Animal Protein Sources  · Meat (all types).  · Poultry.  · Eggs.  · Fish, seafood.  Foods High in Salt  · Salt seasonings.  · Soy  sauce.  · Teriyaki sauce.  · Cured and processed meats.  · Salted crackers and snack foods.  · Fast food.  · Canned soups and most canned foods.  Foods High in Oxalates  · Grains:  ¨ Amaranth.  ¨ Barley.  ¨ Grits.  ¨ Wheat germ.  ¨ Bran.  ¨ Buckwheat flour.  ¨ All bran cereals.  ¨ Pretzels.  ¨ Whole wheat bread.  · Vegetables:  ¨ Beans (wax).  ¨ Beets and beet greens.  ¨ Collard greens.  ¨ Eggplant.  ¨ Escarole.  ¨ Leeks.  ¨ Okra.  ¨ Parsley.  ¨ Rutabagas.  ¨ Spinach.  ¨ Swiss chard.  ¨ Tomato paste.  ¨ Fried potatoes.  ¨ Sweet potatoes.  · Fruits:  ¨ Red currants.  ¨ Figs.  ¨ Kiwi.  ¨ Rhubarb.  · Meat and Other Protein Sources:  ¨ Beans (dried).  ¨ Soy burgers and other soybean products.  ¨ Miso.  ¨ Nuts (peanuts, almonds, pecans, cashews, hazelnuts).  ¨ Nut butters.  ¨ Sesame seeds and tahini (paste made of sesame seeds).  ¨ Poppy seeds.  · Beverages:  ¨ Chocolate drink mixes.  ¨ Soy milk.  ¨ Instant iced tea.  ¨ Juices made from high-oxalate fruits or vegetables.  · Other:  ¨ Carob.  ¨ Chocolate.  ¨ Fruitcake.  ¨ Marmalades.  Document Released:   02/24/2011 Document Revised: 11/04/2013 Document Reviewed: 09/26/2013  ExitCare® Patient Information ©2015 [employer]. This information is not intended to replace advice given to you by your health care provider. Make sure you discuss any questions you have with your health care provider.

## 2015-07-23 ENCOUNTER — Encounter: Payer: Self-pay | Admitting: Urology

## 2015-12-03 ENCOUNTER — Encounter: Payer: Self-pay | Admitting: Urology

## 2015-12-03 ENCOUNTER — Ambulatory Visit: Payer: PRIVATE HEALTH INSURANCE | Admitting: Urology

## 2016-01-02 IMAGING — CR DG ABDOMEN 1V
1 series · 2 of 2 positions shown · non-contrast
Comparison: CT 04/07/2015.

CLINICAL DATA: Ureteral stone.

EXAM:
ABDOMEN - 1 VIEW

[Series 1: dg abd 1 view · 0.14mm/px · 2 of 2 slices shown]
[im 1/2]
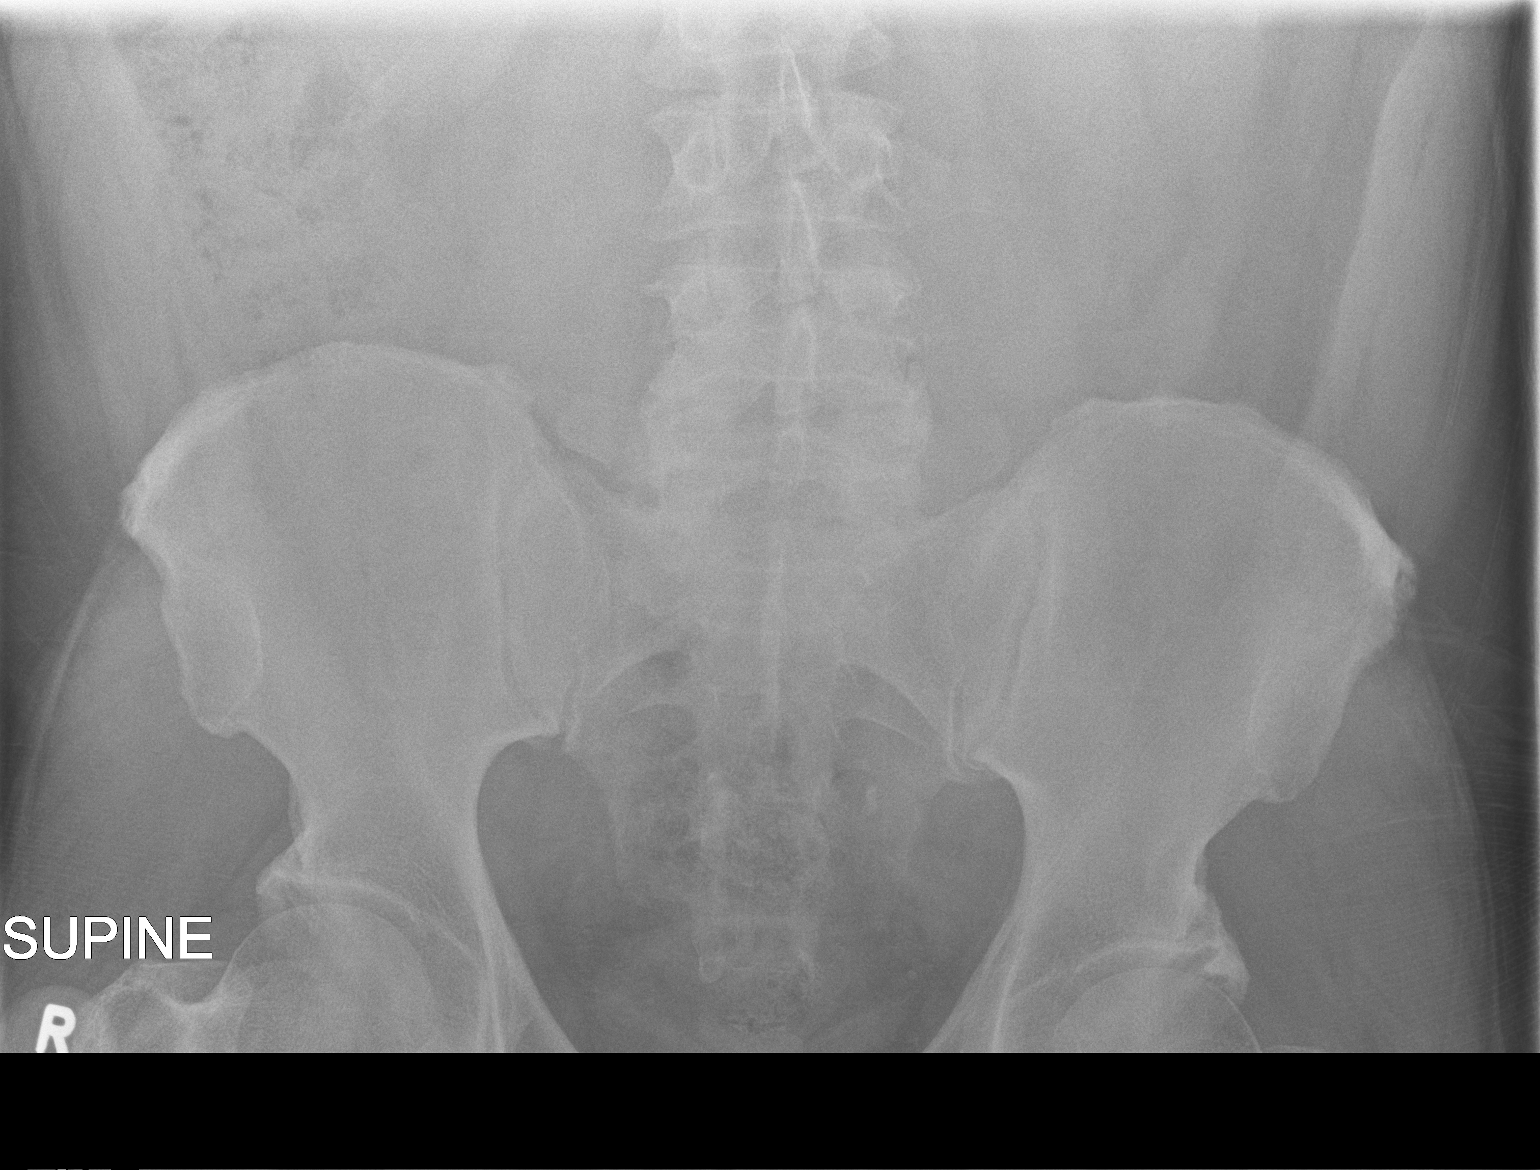
[im 2/2]
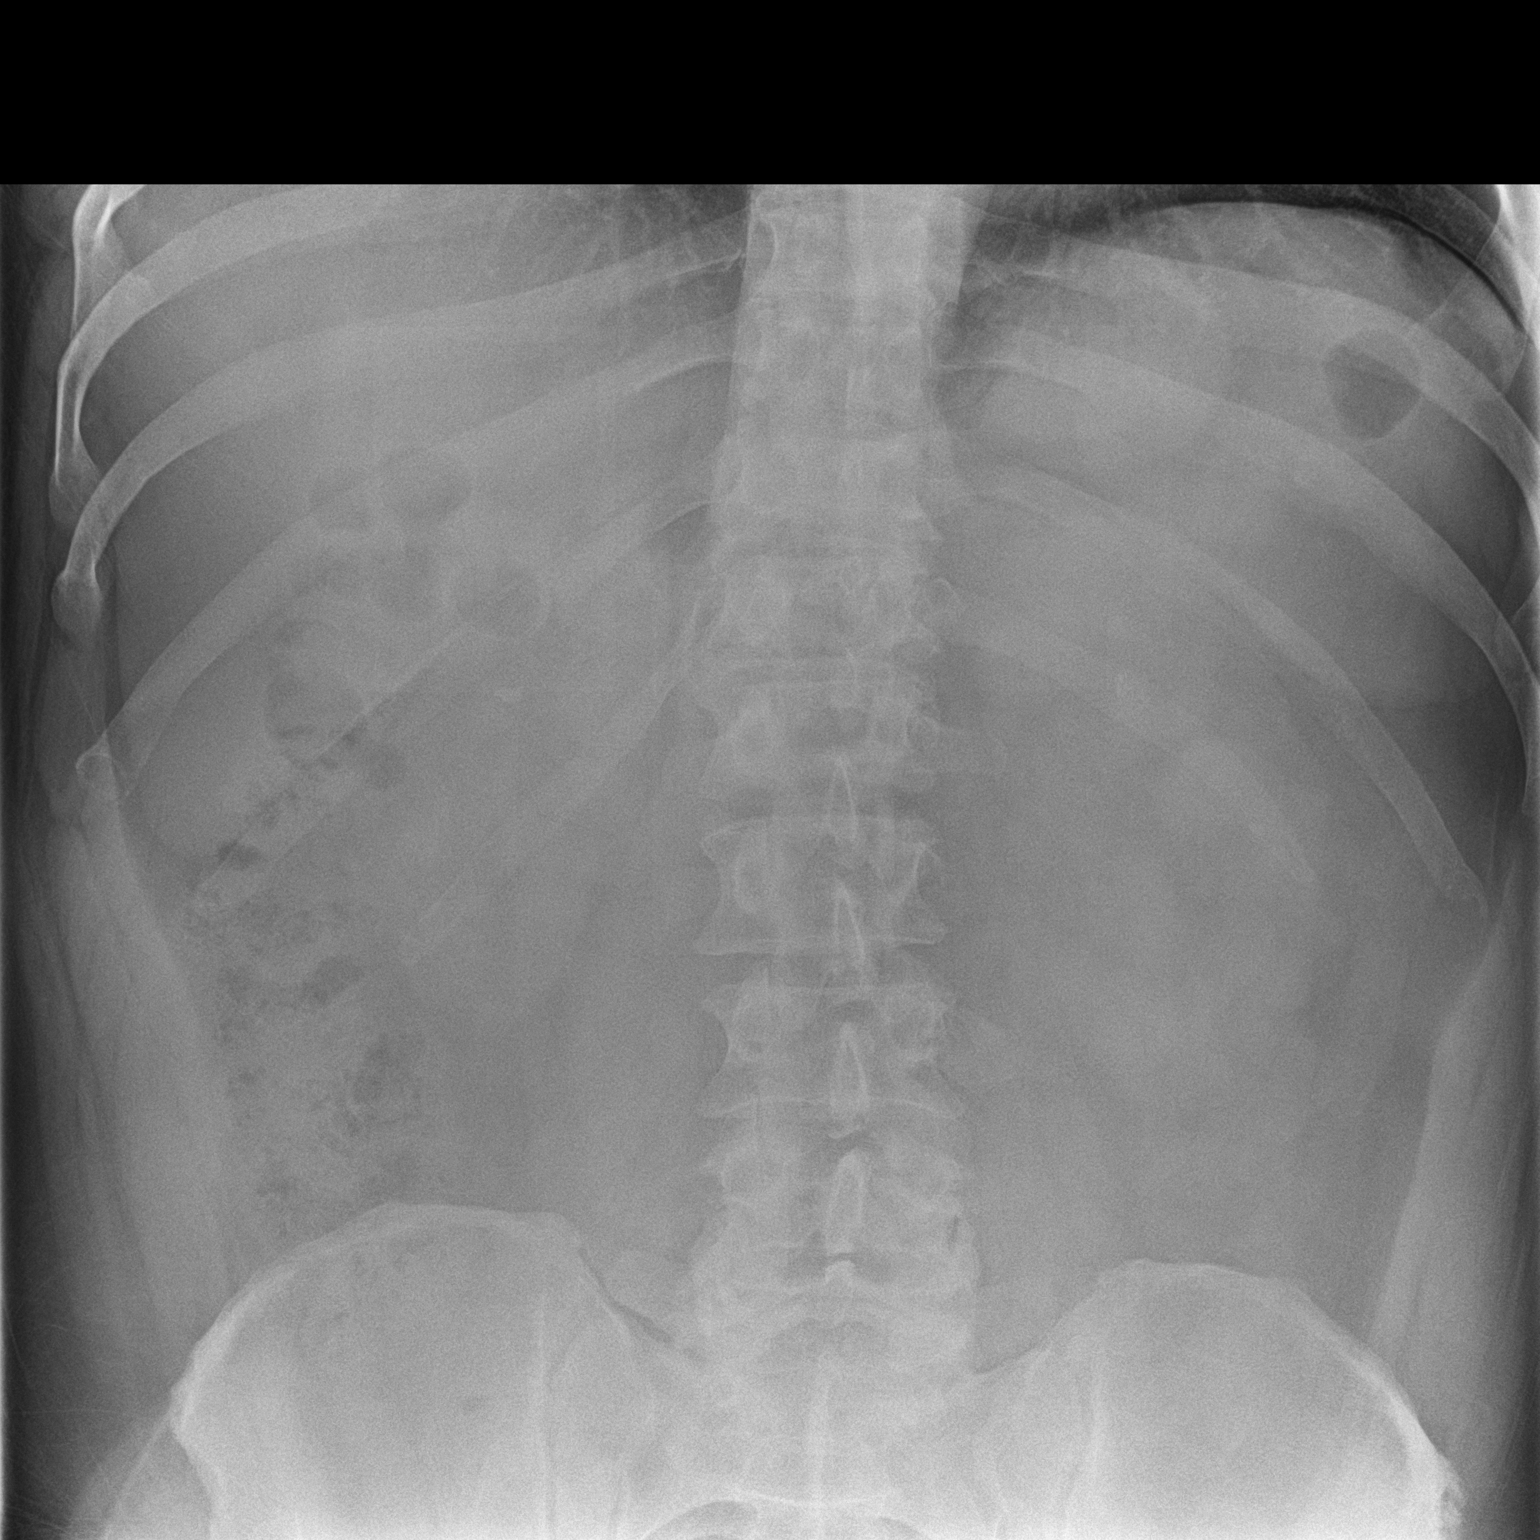

[2 of 2 positions shown; findings below may reference images not displayed]

FINDINGS: Previously identified left upper ureteral stone is no longer
identified. 7 mm calcific density left lower pelvis could represent
distal left ureteral stone fragment or phlebolith. Pelvic
calcifications are present consistent with phleboliths. Stable
bilateral nephrolithiasis. No bowel distention. Degenerative changes
lumbar spine .
IMPRESSION: 1. Previously identified left upper ureteral stone is no longer
identified. 7 mm calcific density left lower pelvis could represent
distal left ureteral stone fragment.

2.  Stable bilateral nephrolithiasis.

## 2016-01-30 IMAGING — CR DG ABDOMEN 1V
1 series · 2 of 2 positions shown · non-contrast
Comparison: None.

CLINICAL DATA: Kidney stones.

EXAM:
ABDOMEN - 1 VIEW

[Series 1: dg abd 1 view · 0.14mm/px · 2 of 2 slices shown]
[im 1/2]
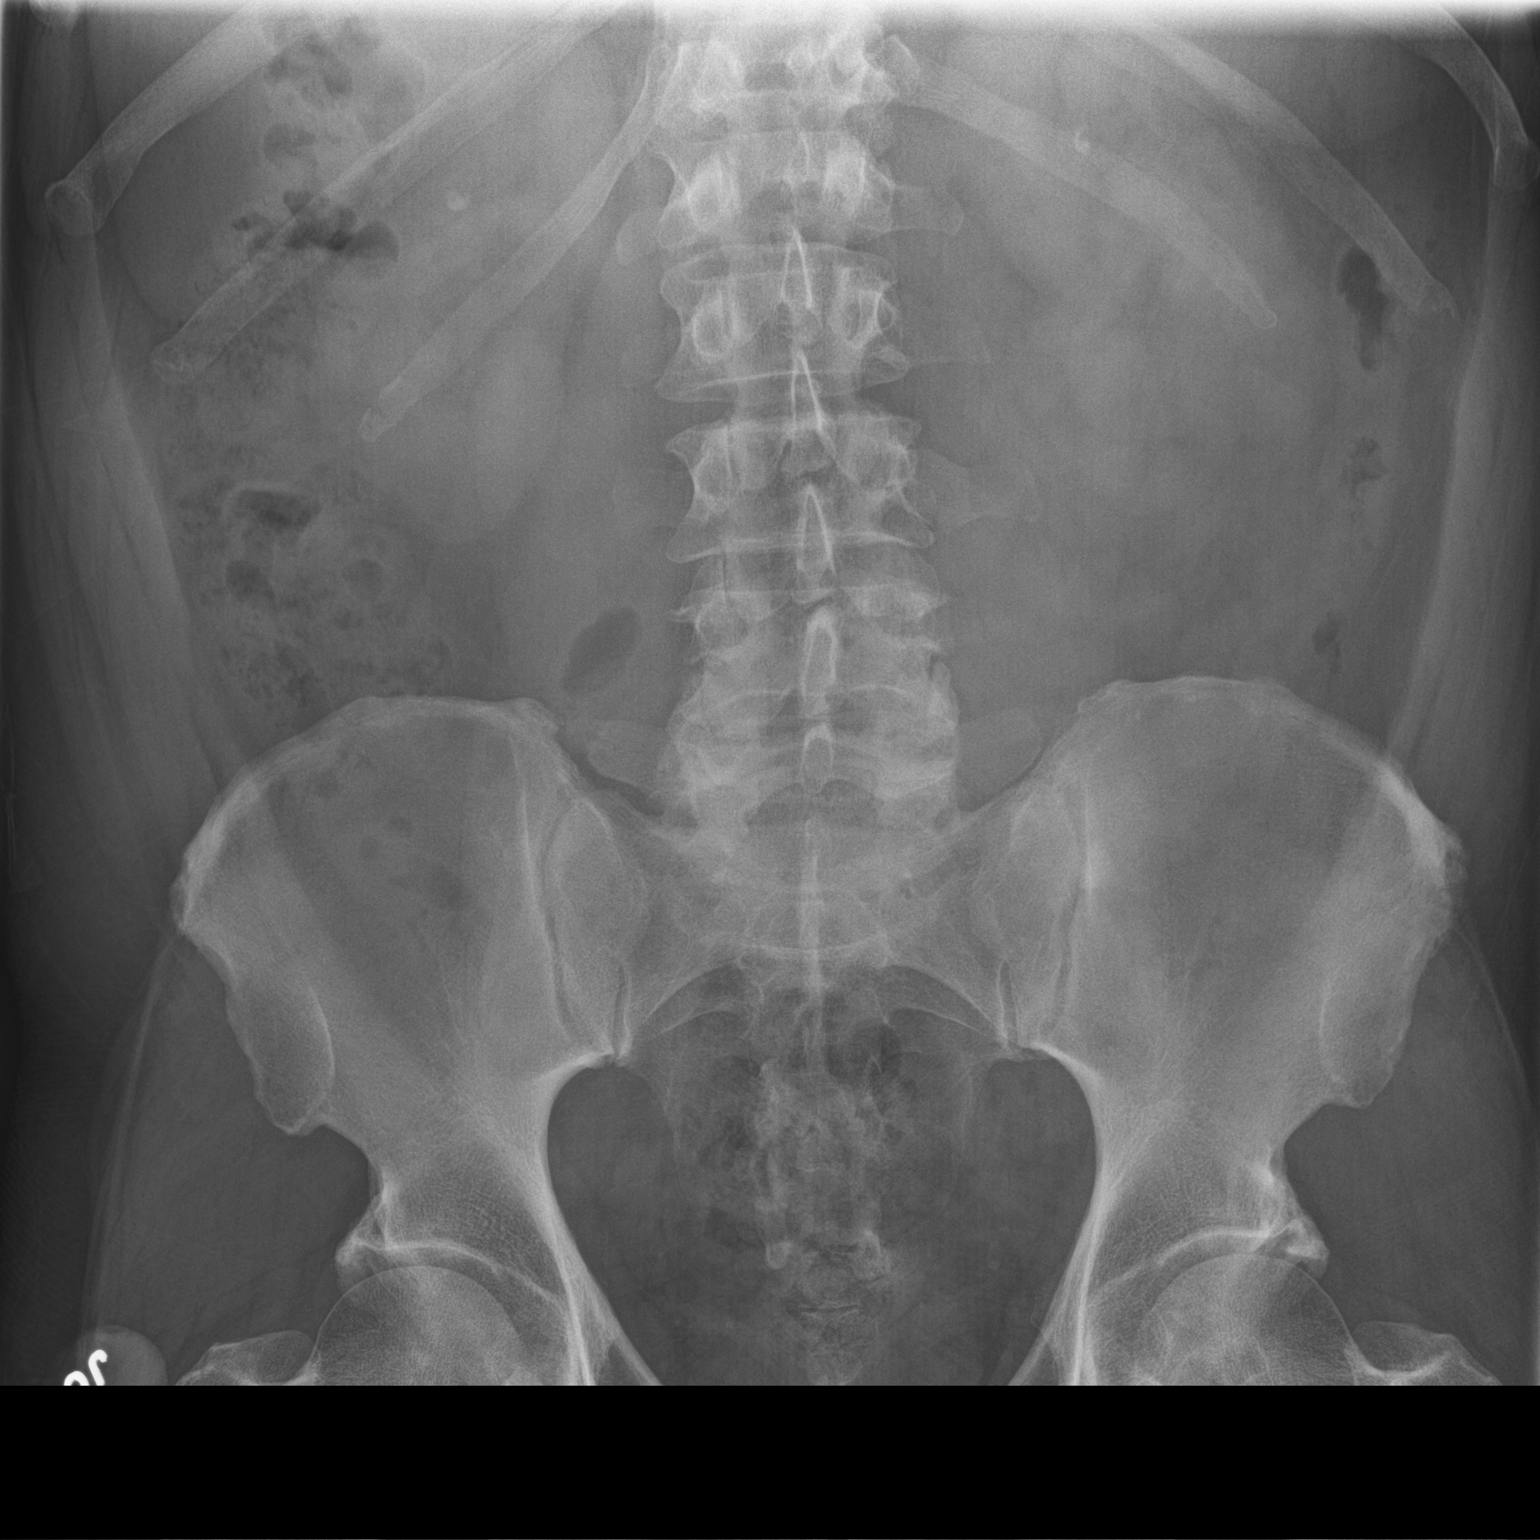
[im 2/2]
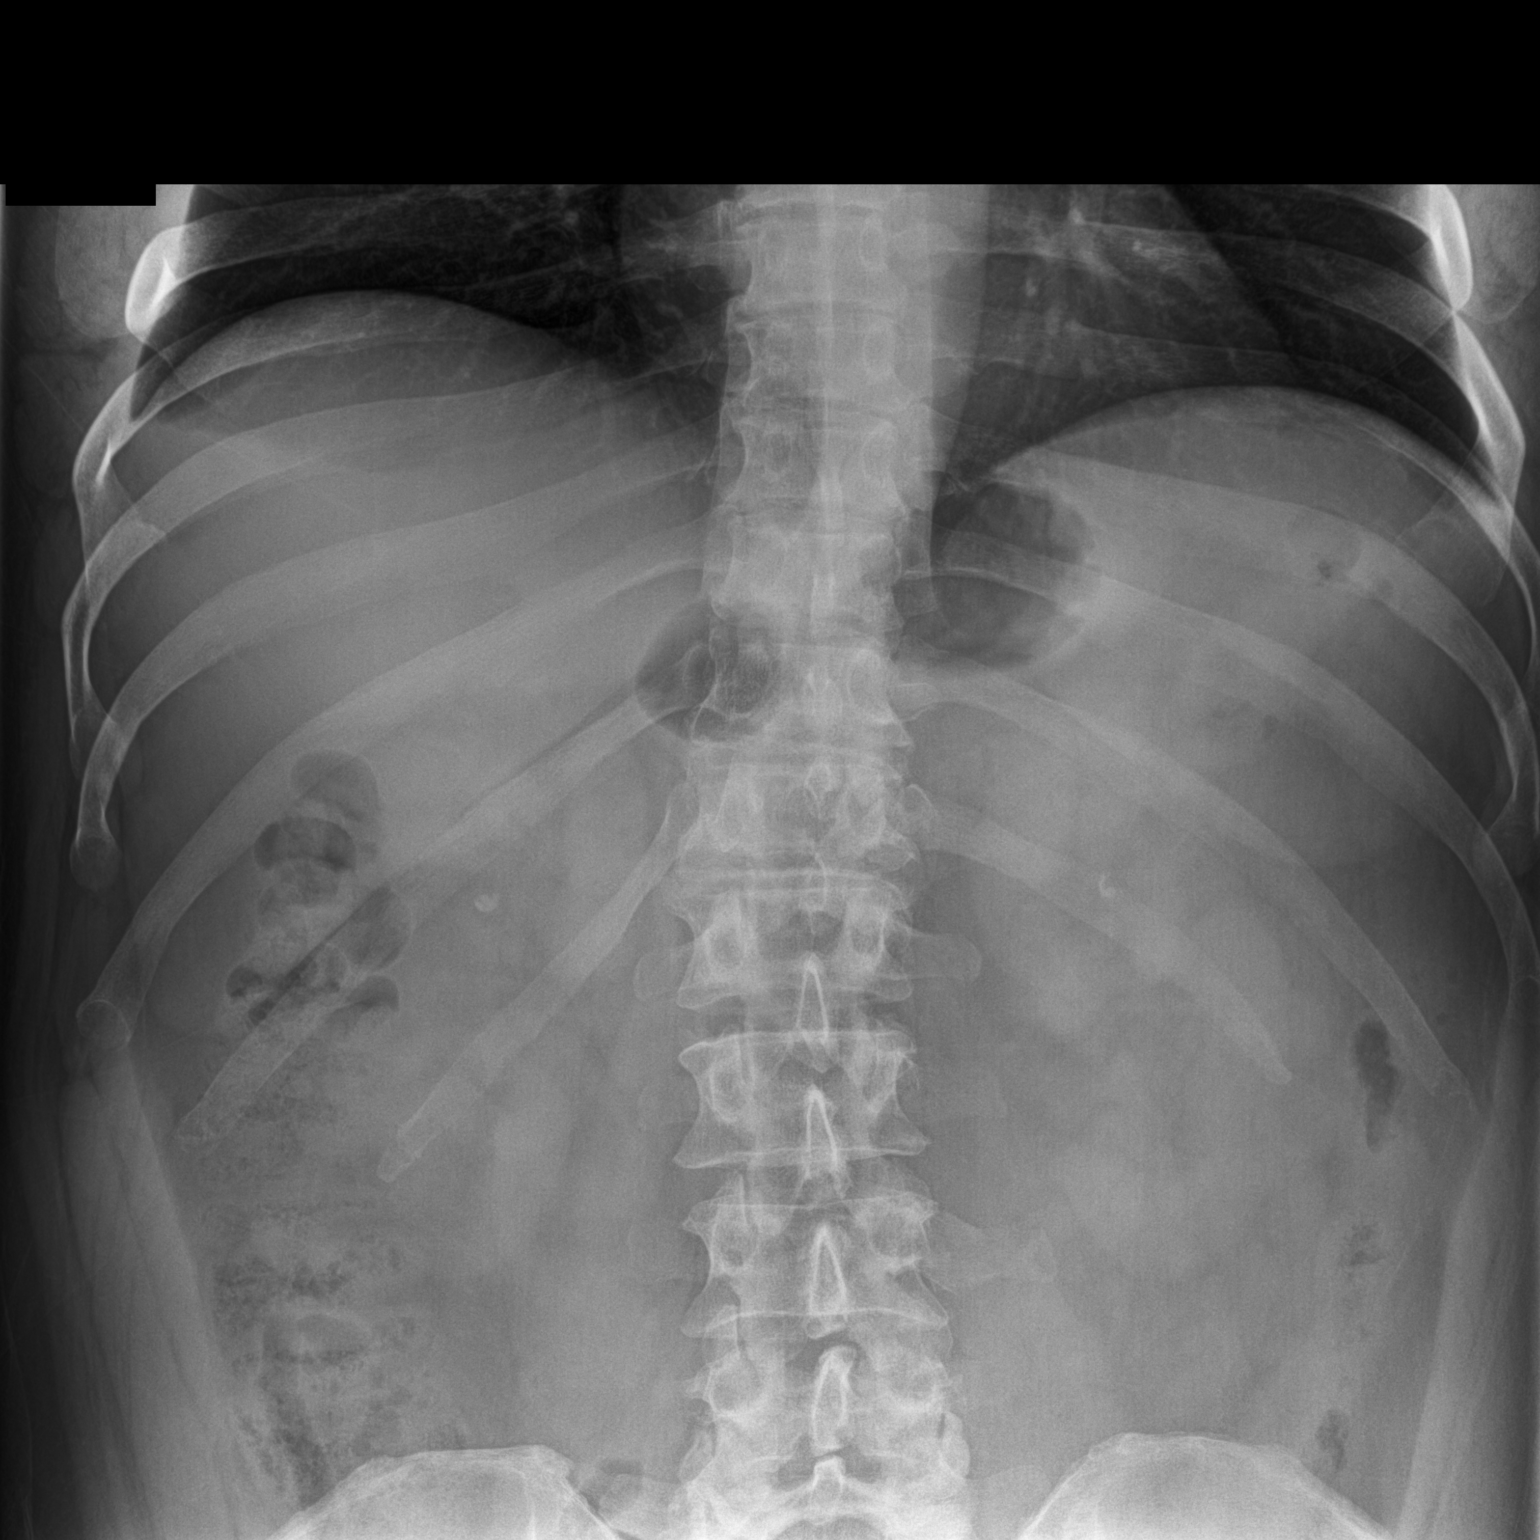

[2 of 2 positions shown; findings below may reference images not displayed]

FINDINGS: Bilateral nephrolithiasis. Previously identified left lower
urolithiasis has cleared. Pelvic calcifications consistent
phleboliths. Bowel gas pattern is unremarkable. No acute bony
abnormality. Degenerative changes lumbar spine .
IMPRESSION: 1. Interim passage of distal left ureteral stone.
2. Stable bilateral nephrolithiasis .
